# Patient Record
Sex: Male | Born: 1960 | Race: White | Hispanic: No | Marital: Single | Smoking: Current every day smoker
Health system: Southern US, Community
[De-identification: ages and names within clinical notes are randomized; demographics above are authoritative.]

---

## 2015-11-28 ENCOUNTER — Emergency Department: Payer: Worker's Compensation

## 2015-11-28 ENCOUNTER — Emergency Department
Admission: EM | Admit: 2015-11-28 | Discharge: 2015-11-28 | Disposition: A | Discharge status: Home / Self Care | Payer: Worker's Compensation | Attending: Emergency Medicine | Admitting: Emergency Medicine

## 2015-11-28 DIAGNOSIS — S52202A Unspecified fracture of shaft of left ulna, initial encounter for closed fracture: Secondary | ICD-10-CM | POA: Diagnosis not present

## 2015-11-28 DIAGNOSIS — Y990 Civilian activity done for income or pay: Secondary | ICD-10-CM | POA: Insufficient documentation

## 2015-11-28 DIAGNOSIS — F17200 Nicotine dependence, unspecified, uncomplicated: Secondary | ICD-10-CM | POA: Insufficient documentation

## 2015-11-28 DIAGNOSIS — Y9389 Activity, other specified: Secondary | ICD-10-CM | POA: Diagnosis not present

## 2015-11-28 DIAGNOSIS — Y929 Unspecified place or not applicable: Secondary | ICD-10-CM | POA: Insufficient documentation

## 2015-11-28 DIAGNOSIS — W208XXA Other cause of strike by thrown, projected or falling object, initial encounter: Secondary | ICD-10-CM | POA: Insufficient documentation

## 2015-11-28 DIAGNOSIS — S59912A Unspecified injury of left forearm, initial encounter: Secondary | ICD-10-CM | POA: Diagnosis present

## 2015-11-28 MED ORDER — IBUPROFEN 600 MG PO TABS: 600.0000 mg | ORAL_TABLET | Freq: Three times a day (TID) | ORAL | 0 refills | Status: AC | PRN

## 2015-11-28 MED ORDER — OXYCODONE-ACETAMINOPHEN 7.5-325 MG PO TABS: 1.0000 | ORAL_TABLET | ORAL | 0 refills | Status: AC | PRN

## 2015-11-28 NOTE — ED Provider Notes (Signed)
[facility] Emergency Department Provider Note   ____________________________________________   None    (approximate)  I have reviewed the triage vital signs and the nursing notes.   HISTORY  Chief Complaint Arm Pain    HPI Charles Fields is a 55 y.o. male patient currently of left forearm pain secondary to a 30 foot steel beam that fell against his arm. Incident occurred at work prior to arrival. Patient state pain is unknown aspect of the forearm. Patient denies loss sensation or loss of function of the upper extremity. Patient state pain is increased with pronation or supination. Patient rates his pain as 8/10. Patient described a pain as "sharp". No palliative measures taken prior to arrival. He is right-hand dominant  History reviewed. No pertinent past medical history.  There are no active problems to display for this patient.   History reviewed. No pertinent surgical history.  Prior to Admission medications   Medication Sig Start Date End Date Taking? Authorizing Provider  ibuprofen (ADVIL,MOTRIN) 600 MG tablet Take 1 tablet (600 mg total) by mouth every 8 (eight) hours as needed. 11/28/15   Joni Reining, PA-C  oxyCODONE-acetaminophen (PERCOCET) 7.5-325 MG tablet Take 1 tablet by mouth every 4 (four) hours as needed for severe pain. 11/28/15   Joni Reining, PA-C    Allergies Review of patient's allergies indicates no known allergies.  No family history on file.  Social History Social History  Substance Use Topics  . Smoking status: Current Every Day Smoker  . Smokeless tobacco: Never Used  . Alcohol use Yes    Review of Systems Constitutional: No fever/chills Eyes: No visual changes. ENT: No sore throat. Cardiovascular: Denies chest pain. Respiratory: Denies shortness of breath. Gastrointestinal: No abdominal pain.  No nausea, no vomiting.  No diarrhea.  No constipation. Genitourinary: Negative for dysuria. Musculoskeletal: Left  forearm pain Skin: Negative for rash. Neurological: Negative for headaches, focal weakness or numbness.    ____________________________________________   PHYSICAL EXAM:  VITAL SIGNS: ED Triage Vitals  Enc Vitals Group     BP --      Pulse --      Resp --      Temp 11/28/15 1022 98 F (36.7 C)     Temp Source 11/28/15 1022 Oral     SpO2 11/28/15 1022 96 %     Weight 11/28/15 1022 185 lb (83.9 kg)     Height 11/28/15 1022 5\' 9"  (1.753 m)     Head Circumference --      Peak Flow --      Pain Score 11/28/15 1012 8     Pain Loc --      Pain Edu? --      Excl. in GC? --     Constitutional: Alert and oriented. Well appearing and in no acute distress. Eyes: Conjunctivae are normal. PERRL. EOMI. Head: Atraumatic. Nose: No congestion/rhinnorhea. Mouth/Throat: Mucous membranes are moist.  Oropharynx non-erythematous. Neck: No stridor.  No cervical spine tenderness to palpation. Hematological/Lymphatic/Immunilogical: No cervical lymphadenopathy. Cardiovascular: Normal rate, regular rhythm. Grossly normal heart sounds.  Good peripheral circulation. Respiratory: Normal respiratory effort.  No retractions. Lungs CTAB. Gastrointestinal: Soft and nontender. No distention. No abdominal bruits. No CVA tenderness. Musculoskeletal: No obvious deformity of the left forearm. There is some mild edema. Patient has moderate guarding with palpation at the distal shaft of the ulnar. Pulses are intact. Neurologic:  Normal speech and language. No gross focal neurologic deficits are appreciated. No gait instability.  Skin:  Skin is warm, dry and intact. No rash noted. Psychiatric: Mood and affect are normal. Speech and behavior are normal.  ____________________________________________   LABS (all labs ordered are listed, but only abnormal results are displayed)  Labs Reviewed - No data to  display ____________________________________________  EKG   ____________________________________________  RADIOLOGY  X-ray is consistent with mild displacement distal ulnar shaft fracture. ____________________________________________   PROCEDURES  Procedure(s) performed: None  Procedures  Critical Care performed: No  ____________________________________________   INITIAL IMPRESSION / ASSESSMENT AND PLAN / ED COURSE  Pertinent labs & imaging results that were available during my care of the patient were reviewed by me and considered in my medical decision making (see chart for details).  Distal left ulna fracture. Discussed x-ray finding with patient. Patient placed in an ulnar gutter splint. Patient given prescription for Percocets. Patient advised to follow orthopedic Dr. Definitive evaluation and treatment.  Clinical Course     ____________________________________________   FINAL CLINICAL IMPRESSION(S) / ED DIAGNOSES  Final diagnoses:  Ulnar fracture, left, closed, initial encounter      NEW MEDICATIONS STARTED DURING THIS VISIT:  New Prescriptions   IBUPROFEN (ADVIL,MOTRIN) 600 MG TABLET    Take 1 tablet (600 mg total) by mouth every 8 (eight) hours as needed.   OXYCODONE-ACETAMINOPHEN (PERCOCET) 7.5-325 MG TABLET    Take 1 tablet by mouth every 4 (four) hours as needed for severe pain.     Note:  This document was prepared using Dragon voice recognition software and may include unintentional dictation errors.    Joni ReiningRonald K Kariana Wiles, PA-C 11/28/15 1151    Rockne MenghiniAnne-Caroline Norman, MD 11/28/15 [phone]

## 2015-11-28 NOTE — ED Notes (Signed)
Called [facility] spoke with Jerene CannySusan Mash she states he here and their requesting a urine drug screen  only

## 2015-11-28 NOTE — ED Notes (Signed)
Walking drug screen to lab at this time.

## 2015-11-28 NOTE — ED Triage Notes (Signed)
Pt states he was working with a 1930ft steel beam and fell hitting his left FA.,. No obvious deformity noted on arrival..

## 2015-11-28 NOTE — Discharge Instructions (Signed)
Wear splint until evaluation by orthopedic Dr. °

## 2015-11-28 NOTE — ED Notes (Signed)
See triage note  Had 30 pd steel beam fall onto left forearm  Pain noted to forearm with questionable deformity  Positive pulses

## 2015-12-10 ENCOUNTER — Encounter: Payer: Self-pay | Admitting: *Deleted

## 2015-12-10 ENCOUNTER — Emergency Department
Admission: EM | Admit: 2015-12-10 | Discharge: 2015-12-10 | Disposition: A | Discharge status: Home / Self Care | Payer: BLUE CROSS/BLUE SHIELD | Attending: Emergency Medicine | Admitting: Emergency Medicine

## 2015-12-10 DIAGNOSIS — S40862A Insect bite (nonvenomous) of left upper arm, initial encounter: Secondary | ICD-10-CM | POA: Diagnosis present

## 2015-12-10 DIAGNOSIS — Z791 Long term (current) use of non-steroidal anti-inflammatories (NSAID): Secondary | ICD-10-CM | POA: Insufficient documentation

## 2015-12-10 DIAGNOSIS — W57XXXA Bitten or stung by nonvenomous insect and other nonvenomous arthropods, initial encounter: Secondary | ICD-10-CM | POA: Diagnosis not present

## 2015-12-10 DIAGNOSIS — Y92007 Garden or yard of unspecified non-institutional (private) residence as the place of occurrence of the external cause: Secondary | ICD-10-CM | POA: Diagnosis not present

## 2015-12-10 DIAGNOSIS — F17200 Nicotine dependence, unspecified, uncomplicated: Secondary | ICD-10-CM | POA: Diagnosis not present

## 2015-12-10 DIAGNOSIS — Y999 Unspecified external cause status: Secondary | ICD-10-CM | POA: Diagnosis not present

## 2015-12-10 DIAGNOSIS — Y9389 Activity, other specified: Secondary | ICD-10-CM | POA: Diagnosis not present

## 2015-12-10 DIAGNOSIS — L03114 Cellulitis of left upper limb: Secondary | ICD-10-CM | POA: Insufficient documentation

## 2015-12-10 MED ORDER — DOXYCYCLINE HYCLATE 100 MG PO CAPS: 100.0000 mg | ORAL_CAPSULE | Freq: Two times a day (BID) | ORAL | 0 refills | Status: AC

## 2015-12-10 NOTE — ED Triage Notes (Signed)
Pt presents to ED with insect bite to left elbow. No fevers reported and no discharge noted. Redness and heat around a small black circle noted on the back of left elbow.

## 2015-12-10 NOTE — ED Provider Notes (Signed)
[facility] Emergency Department Provider Note  ____________________________________________  Time seen: Approximately 6:29 PM  I have reviewed the triage vital signs and the nursing notes.   HISTORY  Chief Complaint Insect Bite   HPI Kell Ferris is a 55 y.o. male who presents to the emergency department for evaluation of a lesion on his left upper arm, just above his elbow. 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 Symptoms started3 days ago. No improvement with antibiotic ointment.  History reviewed. No pertinent past medical history.  There are no active problems to display for this patient.   History reviewed. No pertinent surgical history.  Prior to Admission medications   Medication Sig Start Date End Date Taking? Authorizing Provider  doxycycline (VIBRAMYCIN) 100 MG capsule Take 1 capsule (100 mg total) by mouth 2 (two) times daily. 12/10/15   Chinita Pester, FNP  ibuprofen (ADVIL,MOTRIN) 600 MG tablet Take 1 tablet (600 mg total) by mouth every 8 (eight) hours as needed. 11/28/15   Joni Reining, PA-C  oxyCODONE-acetaminophen (PERCOCET) 7.5-325 MG tablet Take 1 tablet by mouth every 4 (four) hours as needed for severe pain. 11/28/15   Joni Reining, PA-C    Allergies Review of patient's allergies indicates no known allergies.  History reviewed. No pertinent family history.  Social History Social History  Substance Use Topics  . Smoking status: Current Every Day Smoker  . Smokeless tobacco: Never Used  . Alcohol use Yes    Review of Systems  Constitutional: Negative for fever/chills. Positive for recent injury--left ulnar fracture. Respiratory: Negative for shortness of breath. Musculoskeletal: Positive for pain. Skin: Positive for lesion  Neurological: Negative for headaches, focal weakness or numbness. ____________________________________________   PHYSICAL EXAM:  VITAL SIGNS: ED Triage Vitals  Enc  Vitals Group     BP 12/10/15 1820 134/90     Pulse Rate 12/10/15 1820 75     Resp 12/10/15 1820 16     Temp 12/10/15 1820 98.6 F (37 C)     Temp Source 12/10/15 1820 Oral     SpO2 12/10/15 1820 97 %     Weight 12/10/15 1821 185 lb (83.9 kg)     Height 12/10/15 1821 5\' 9"  (1.753 m)     Head Circumference --      Peak Flow --      Pain Score --      Pain Loc --      Pain Edu? --      Excl. in GC? --      Constitutional: Alert and oriented. Well appearing and in no acute distress. Eyes: Conjunctivae are normal. EOMI. Nose: No congestion/rhinnorhea. Mouth/Throat: Mucous membranes are moist.   Neck: No stridor. Lymphatic: No cervical lymphadenopathy. Cardiovascular: Good peripheral circulation. Respiratory: Normal respiratory effort.  No retractions. Musculoskeletal:  Neurologic:  Normal speech and language. No gross focal neurologic deficits are appreciated. Skin:  Erythematous, indurated area to the left upper arm just above the elbow on the deltoid with a central area of necrosis.  ____________________________________________   LABS (all labs ordered are listed, but only abnormal results are displayed)  Labs Reviewed - No data to display ____________________________________________  EKG   ____________________________________________  RADIOLOGY  Not indicated. ____________________________________________   PROCEDURES  Procedure(s) performed: None ____________________________________________   INITIAL IMPRESSION / ASSESSMENT AND PLAN / ED COURSE  Clinical Course    Pertinent labs & imaging results that were available during my care of the patient  were reviewed by me and considered in my medical decision making (see chart for details).  He will be advised to take the doxycycline as prescribed.  He was advised to follow up with the PCP of his choice if not improving over the next 2 days.  He was also advised to return to the emergency department for  symptoms that change or worsen if unable to schedule an appointment.  ____________________________________________   FINAL CLINICAL IMPRESSION(S) / ED DIAGNOSES  Final diagnoses:  Cellulitis of left upper extremity    New Prescriptions   DOXYCYCLINE (VIBRAMYCIN) 100 MG CAPSULE    Take 1 capsule (100 mg total) by mouth 2 (two) times daily.    Note:  This document was prepared using Dragon voice recognition software and may include unintentional dictation errors.    Chinita PesterCari B Robena Ewy, FNP 12/10/15 1851    Nita Sicklearolina Veronese, MD 12/11/15 [phone]

## 2017-08-28 IMAGING — DX DG FOREARM 2V*L*
2 series · 2 of 2 positions shown · non-contrast
Comparison: None.

CLINICAL DATA: Heavy object fell on forearm.

EXAM:
LEFT FOREARM - 2 VIEW

[forearm ap]
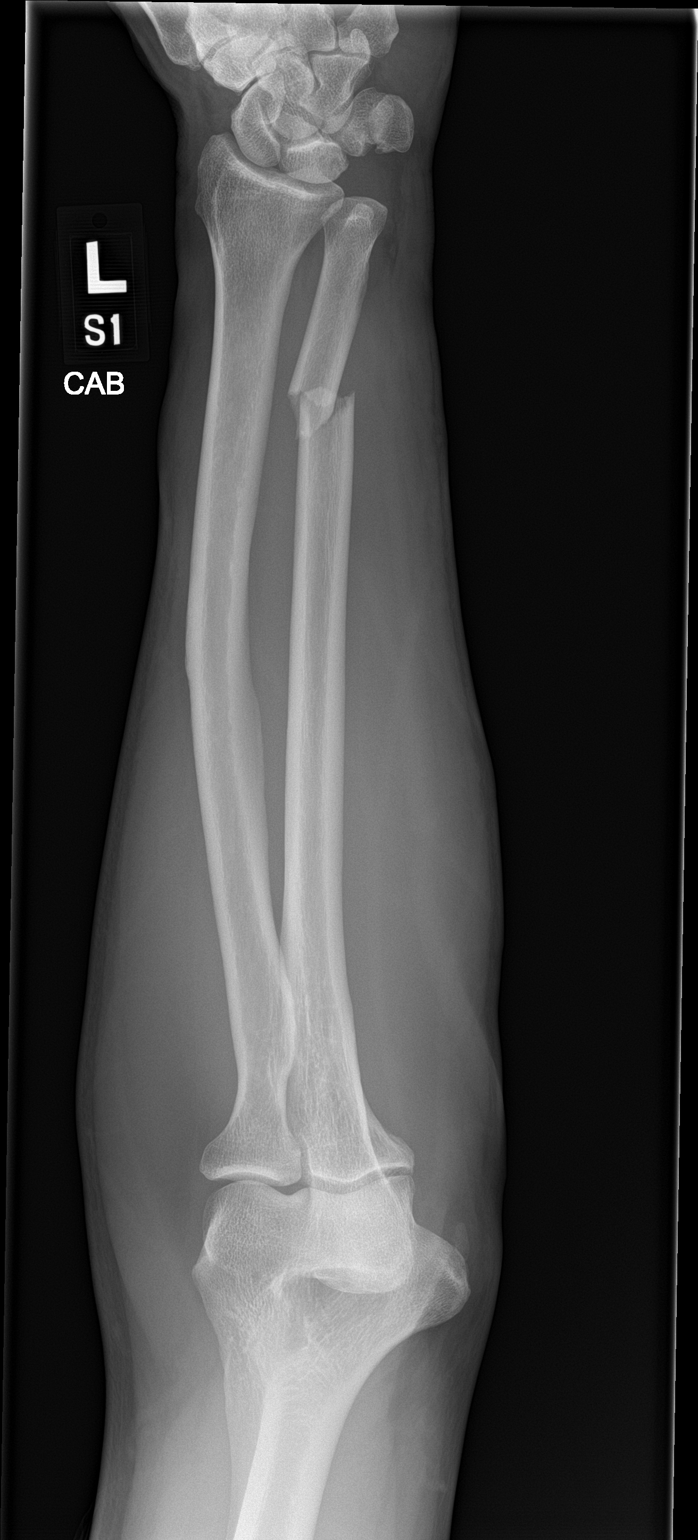

[forearm lat]
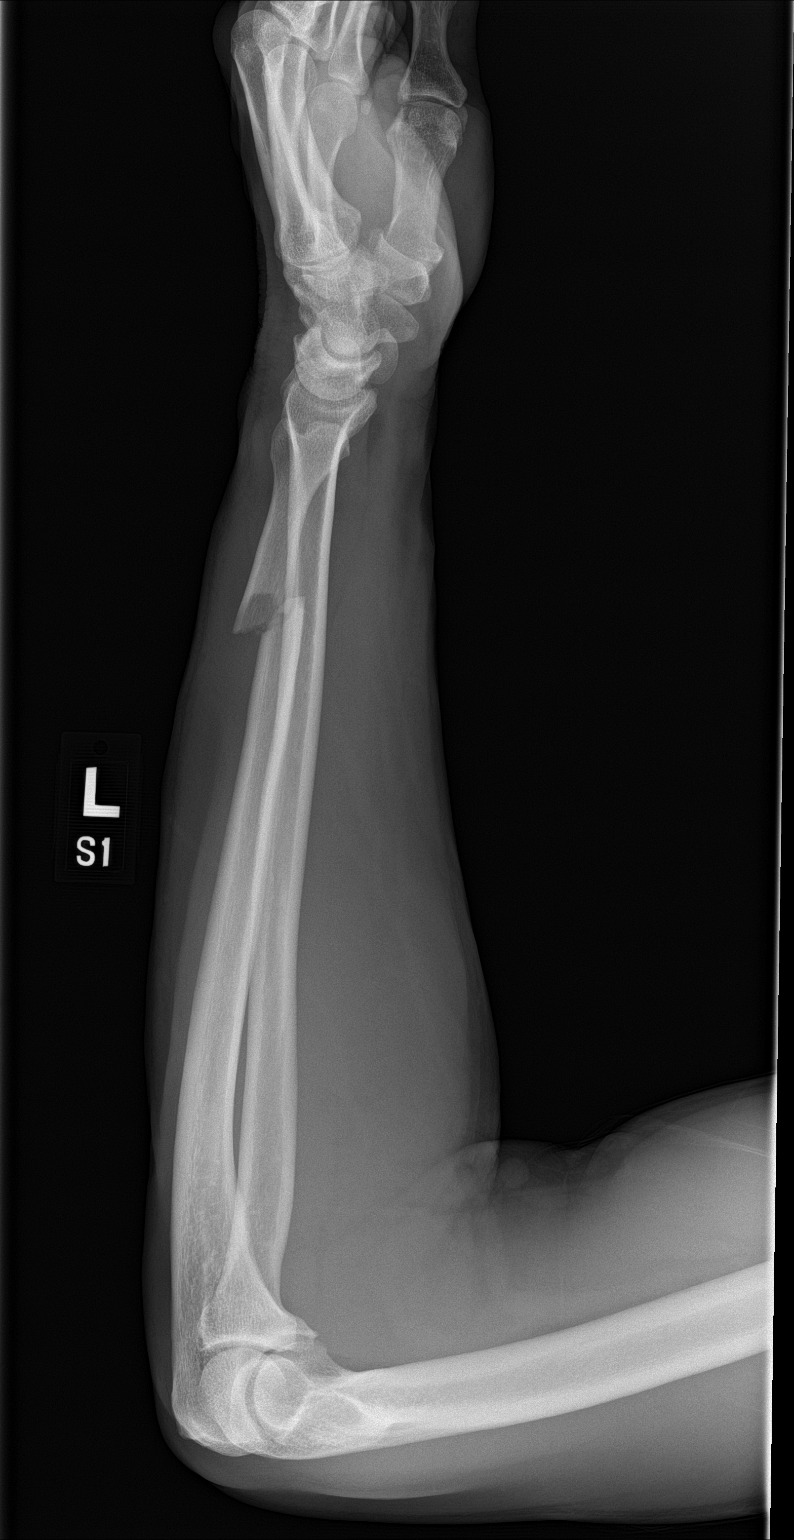

[2 of 2 positions shown; findings below may reference images not displayed]

FINDINGS: There is a mildly displaced fracture involving the distal ulna
diaphysis. There is [DATE] shaft width of dorsal and radial
displacement.

No fracture of the radius is identified. The wrist and elbow joints
are maintained.
IMPRESSION: Mildly displaced distal ulnar shaft fracture.

No radius fracture.
